# Patient Record
Sex: Male | Born: 1985 | Race: Black or African American | Hispanic: No | Smoking: Current every day smoker
Health system: Southern US, Community
[De-identification: ages and names within clinical notes are randomized; demographics above are authoritative.]

---

## 2009-07-25 ENCOUNTER — Emergency Department: Payer: Self-pay | Admitting: Emergency Medicine

## 2009-07-28 ENCOUNTER — Emergency Department: Payer: Self-pay | Admitting: Internal Medicine

## 2009-07-31 ENCOUNTER — Emergency Department: Payer: Self-pay | Admitting: Emergency Medicine

## 2012-07-16 ENCOUNTER — Encounter (HOSPITAL_COMMUNITY): Payer: Self-pay | Admitting: Family Medicine

## 2012-07-16 ENCOUNTER — Emergency Department (HOSPITAL_COMMUNITY)
Admission: EM | Admit: 2012-07-16 | Discharge: 2012-07-16 | Disposition: A | Discharge status: Home / Self Care | Payer: No Typology Code available for payment source | Attending: Emergency Medicine | Admitting: Emergency Medicine

## 2012-07-16 ENCOUNTER — Emergency Department (HOSPITAL_COMMUNITY): Payer: No Typology Code available for payment source

## 2012-07-16 DIAGNOSIS — Y9389 Activity, other specified: Secondary | ICD-10-CM | POA: Insufficient documentation

## 2012-07-16 DIAGNOSIS — R42 Dizziness and giddiness: Secondary | ICD-10-CM | POA: Insufficient documentation

## 2012-07-16 DIAGNOSIS — Y92410 Unspecified street and highway as the place of occurrence of the external cause: Secondary | ICD-10-CM | POA: Insufficient documentation

## 2012-07-16 DIAGNOSIS — S060X9A Concussion with loss of consciousness of unspecified duration, initial encounter: Secondary | ICD-10-CM

## 2012-07-16 DIAGNOSIS — S060XAA Concussion with loss of consciousness status unknown, initial encounter: Secondary | ICD-10-CM | POA: Insufficient documentation

## 2012-07-16 DIAGNOSIS — F17200 Nicotine dependence, unspecified, uncomplicated: Secondary | ICD-10-CM | POA: Insufficient documentation
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
## 2012-07-18 ENCOUNTER — Emergency Department: Payer: Self-pay | Admitting: Emergency Medicine

## 2013-08-13 ENCOUNTER — Emergency Department: Payer: Self-pay

## 2013-08-15 LAB — BETA STREP CULTURE(ARMC)
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
## 2016-10-29 ENCOUNTER — Encounter: Payer: Self-pay | Admitting: Emergency Medicine

## 2016-10-29 ENCOUNTER — Emergency Department
Admission: EM | Admit: 2016-10-29 | Discharge: 2016-10-29 | Disposition: A | Discharge status: Home / Self Care | Payer: Self-pay | Attending: Emergency Medicine | Admitting: Emergency Medicine

## 2016-10-29 DIAGNOSIS — M778 Other enthesopathies, not elsewhere classified: Secondary | ICD-10-CM

## 2016-10-29 DIAGNOSIS — M779 Enthesopathy, unspecified: Secondary | ICD-10-CM | POA: Insufficient documentation

## 2016-10-29 DIAGNOSIS — F17200 Nicotine dependence, unspecified, uncomplicated: Secondary | ICD-10-CM | POA: Insufficient documentation

## 2016-10-29 DIAGNOSIS — K047 Periapical abscess without sinus: Secondary | ICD-10-CM | POA: Insufficient documentation

## 2016-10-29 MED ORDER — AMOXICILLIN 500 MG PO CAPS: 500.0000 mg | ORAL_CAPSULE | Freq: Three times a day (TID) | ORAL | 0 refills | Status: DC

## 2016-10-29 MED ORDER — NAPROXEN 500 MG PO TABS: 500.0000 mg | ORAL_TABLET | Freq: Two times a day (BID) | ORAL | Status: DC

## 2016-10-29 MED ORDER — TRAMADOL HCL 50 MG PO TABS: 50.0000 mg | ORAL_TABLET | Freq: Four times a day (QID) | ORAL | 0 refills | Status: DC | PRN

## 2016-10-29 NOTE — ED Triage Notes (Signed)
Pt presents to ED with c/o R sided facial swelling at the upper jaw line. Pt states swelling started yesterday. Pt states a few weeks ago a tooth fell out of his R upper jaw. Pt also c/o L elbow pain, pt states pain is intermittent and intermittent difficulty straightening out his L arm due to the pain.

## 2016-10-29 NOTE — ED Notes (Signed)
Pt states he woke up this AM with swelling to the right side of his face, states he believes he has a dental abscess, resp even and unlabored, in no acute distress

## 2016-10-29 NOTE — ED Provider Notes (Signed)
[facility] Emergency Department Provider Note   ____________________________________________   None    (approximate)  I have reviewed the triage vital signs and the nursing notes.   HISTORY  Chief Complaint Facial Swelling and Elbow Pain    HPI Henry Mccann is a 31 y.o. male patient complaining of right facial swelling started yesterday. Patient denies sinus congestion or ear pressure/pain. Patient denies any fever at this complaint. Patient state 2 weeks ago tooth fell out of his right upper jaw. Patient also complaining left elbow pain. He stated this been intermittently for over 3 months. Patient stated times it hurts to extend the forearm. Patient stated pain is better today. Patient denies any injury or repetitive motions of the elbow. Patient rates his dental pain as a 1/10. No palliative measures for both complaints.   History reviewed. No pertinent past medical history.  There are no active problems to display for this patient.   History reviewed. No pertinent surgical history.  Prior to Admission medications   Medication Sig Start Date End Date Taking? Authorizing Provider  amoxicillin (AMOXIL) 500 MG capsule Take 1 capsule (500 mg total) by mouth 3 (three) times daily. 10/29/16   Joni Reining, PA-C  naproxen (NAPROSYN) 500 MG tablet Take 1 tablet (500 mg total) by mouth 2 (two) times daily with a meal. 10/29/16   Joni Reining, PA-C  traMADol (ULTRAM) 50 MG tablet Take 1 tablet (50 mg total) by mouth every 6 (six) hours as needed for moderate pain. 10/29/16   Joni Reining, PA-C    Allergies Patient has no known allergies.  History reviewed. No pertinent family history.  Social History Social History  Substance Use Topics  . Smoking status: Current Every Day Smoker  . Smokeless tobacco: Never Used  . Alcohol use No    Review of Systems Constitutional: No fever/chills Eyes: No visual changes. ENT: No sore throat.Dental  pain Cardiovascular: Denies chest pain. Respiratory: Denies shortness of breath. Gastrointestinal: No abdominal pain.  No nausea, no vomiting.  No diarrhea.  No constipation. Genitourinary: Negative for dysuria. Musculoskeletal: Left elbow.  Skin: Negative for rash. Facial swelling Neurological: Negative for headaches, focal weakness or numbness.    ____________________________________________   PHYSICAL EXAM:  VITAL SIGNS: ED Triage Vitals  Enc Vitals Group     BP 10/29/16 1306 129/87     Pulse Rate 10/29/16 1306 71     Resp 10/29/16 1306 18     Temp 10/29/16 1306 98.5 F (36.9 C)     Temp Source 10/29/16 1306 Oral     SpO2 10/29/16 1306 100 %     Weight 10/29/16 1303 280 lb (127 kg)     Height 10/29/16 1303  (1.676 m)     Head Circumference --      Peak Flow --      Pain Score 10/29/16 1303 1     Pain Loc --      Pain Edu? --      Excl. in GC? --     Constitutional: Alert and oriented. Well appearing and in no acute distress. Eyes: Conjunctivae are normal. PERRL. EOMI. Head: Atraumatic. Nose: No congestion/rhinnorhea. Mouth/Throat: Mucous membranes are moist.  Oropharynx non-erythematous.Edematous gingiva right upper molar area. Neck: No stridor.  No cervical spine tenderness to palpation. Hematological/Lymphatic/Immunilogical: No cervical lymphadenopathy. Cardiovascular: Normal rate, regular rhythm. Grossly normal heart sounds.  Good peripheral circulation. Respiratory: Normal respiratory effort.  No retractions. Lungs CTAB. Gastrointestinal: Soft and nontender.  No distention. No abdominal bruits. No CVA tenderness. Musculoskeletal: No lower extremity tenderness nor edema.  No joint effusions. Neurologic:  Normal speech and language. No gross focal neurologic deficits are appreciated. No gait instability. Skin:  Skin is warm, dry and intact. No rash noted. Psychiatric: Mood and affect are normal. Speech and behavior are  normal.  ____________________________________________   LABS (all labs ordered are listed, but only abnormal results are displayed)  Labs Reviewed - No data to display ____________________________________________  EKG   ____________________________________________  RADIOLOGY   ____________________________________________   PROCEDURES  Procedure(s) performed: None  Procedures  Critical Care performed: No  ____________________________________________   INITIAL IMPRESSION / ASSESSMENT AND PLAN / ED COURSE  Pertinent labs & imaging results that were available during my care of the patient were reviewed by me and considered in my medical decision making (see chart for details).  Dental abscess and tendinitis of the elbow.      ____________________________________________   FINAL CLINICAL IMPRESSION(S) / ED DIAGNOSES  Final diagnoses:  Dental abscess  Left elbow tendinitis   Patient given discharge Instructions and advised to follow-up with [facility]. Patient also advised [facility].   NEW MEDICATIONS STARTED DURING THIS VISIT:  New Prescriptions   AMOXICILLIN (AMOXIL) 500 MG CAPSULE    Take 1 capsule (500 mg total) by mouth 3 (three) times daily.   NAPROXEN (NAPROSYN) 500 MG TABLET    Take 1 tablet (500 mg total) by mouth 2 (two) times daily with a meal.   TRAMADOL (ULTRAM) 50 MG TABLET    Take 1 tablet (50 mg total) by mouth every 6 (six) hours as needed for moderate pain.     Note:  This document was prepared using Dragon voice recognition software and may include unintentional dictation errors.    Joni Reining, PA-C 10/29/16 1428    Nita Sickle, MD 11/02/16 [phone]

## 2016-11-05 ENCOUNTER — Encounter: Payer: Self-pay | Admitting: Emergency Medicine

## 2016-11-05 DIAGNOSIS — K029 Dental caries, unspecified: Secondary | ICD-10-CM | POA: Insufficient documentation

## 2016-11-05 DIAGNOSIS — F17200 Nicotine dependence, unspecified, uncomplicated: Secondary | ICD-10-CM | POA: Insufficient documentation

## 2016-11-05 DIAGNOSIS — J029 Acute pharyngitis, unspecified: Secondary | ICD-10-CM | POA: Insufficient documentation

## 2016-11-05 LAB — POCT RAPID STREP A: Streptococcus, Group A Screen (Direct): NEGATIVE

## 2016-11-05 NOTE — ED Triage Notes (Signed)
Pt ambulatory to triage with steady gait, no distress noted. Pt c/o of cough, congestion, sore throat and swelling on both sides of neck. Pt reports he was seen last Friday for facial abscess, placed on antibiotics, on Tuesday had tooth taken out of right top, today pt sts "I think this all may have something to do with each other."

## 2016-11-06 ENCOUNTER — Emergency Department
Admission: EM | Admit: 2016-11-06 | Discharge: 2016-11-06 | Disposition: A | Discharge status: Home / Self Care | Payer: Self-pay | Attending: Emergency Medicine | Admitting: Emergency Medicine

## 2016-11-06 DIAGNOSIS — R220 Localized swelling, mass and lump, head: Secondary | ICD-10-CM

## 2016-11-06 DIAGNOSIS — J029 Acute pharyngitis, unspecified: Secondary | ICD-10-CM

## 2016-11-06 DIAGNOSIS — K029 Dental caries, unspecified: Secondary | ICD-10-CM

## 2016-11-06 MED ORDER — DEXAMETHASONE SODIUM PHOSPHATE 10 MG/ML IJ SOLN
10.0000 mg | Freq: Once | INTRAMUSCULAR | Status: AC
  Administered 2016-11-06: 10 mg via INTRAMUSCULAR
  Filled 2016-11-06: qty 1

## 2016-11-06 MED ORDER — KETOROLAC TROMETHAMINE 60 MG/2ML IM SOLN
15.0000 mg | INTRAMUSCULAR | Status: AC
  Administered 2016-11-06: 15 mg via INTRAMUSCULAR
  Filled 2016-11-06: qty 2

## 2016-11-06 NOTE — ED Notes (Signed)
ED Provider at bedside. 

## 2016-11-06 NOTE — Discharge Instructions (Signed)
As we discussed, the only way to be certain that you do not have a spreading deep infection within your face or neck is to obtain another CT scan.  I think that the risk of this currently is low, however, and you decided you do not want to have the CT scan today.  I feel that is appropriate and instead we gave you a dose of Decadron 10 mg intramuscular which should help with the pain and swelling.  I encourage you to continue taking the pain medicine you were prescribed previously and over-the-counter medicine and please definitely continue your antibiotics.  Follow up with a dentist and try to establish a primary care doctor.  If you develop any new or worsening symptoms, including but not limited to fever, worsening swelling, difficulty breathing, etc., please return immediately to the nearest emergency department.

## 2016-11-06 NOTE — ED Provider Notes (Signed)
[facility] Emergency Department Provider Note  ____________________________________________   First MD Initiated Contact with Patient 11/06/16 0139     (approximate)  I have reviewed the triage vital signs and the nursing notes.   HISTORY  Chief Complaint Cough and Sore Throat    HPI Henry Mccann is a 31 y.o. male who presents by private vehicle for evaluation of nasal congestion, sore throat, some swelling of his face and neck.  He was seen recently in this emergency department and started on antibiotics for a dental abscess.  He subsequently presented to [facility], was evaluated in the emergency department and had a CT scan which was reassuring and then went to the dental clinic for partial extractions.His [facility] visit was less than 3 days ago.  He is still taking the amoxicillin and states that the swelling is actually gotten better but that his sore throat has persisted.  He says it hurts worse when he swallows.  Nothing in particular makes it better or worse.  He thinks his voice seems muffled but it is hard to tell because his mouth is still swollen as well.  He denies fever/chills, chest pain, shortness of breath, nausea, vomiting.  He states that his nose is been really congested recently 2 and he has had a lot of drainage down the back of his throat and he thinks that might be why his throat is sore.  The sore throat has been present for or than 3 days.   History reviewed. No pertinent past medical history.  There are no active problems to display for this patient.   History reviewed. No pertinent surgical history.  Prior to Admission medications   Medication Sig Start Date End Date Taking? Authorizing Provider  amoxicillin (AMOXIL) 500 MG capsule Take 1 capsule (500 mg total) by mouth 3 (three) times daily. 10/29/16   Joni Reining, PA-C  naproxen (NAPROSYN) 500 MG tablet Take 1 tablet (500 mg total) by mouth 2 (two) times daily with a meal. 10/29/16    Joni Reining, PA-C  traMADol (ULTRAM) 50 MG tablet Take 1 tablet (50 mg total) by mouth every 6 (six) hours as needed for moderate pain. 10/29/16   Joni Reining, PA-C    Allergies Patient has no known allergies.  History reviewed. No pertinent family history.  Social History Social History  Substance Use Topics  . Smoking status: Current Every Day Smoker  . Smokeless tobacco: Never Used  . Alcohol use No    Review of Systems Constitutional: No fever/chills Eyes: No visual changes. ENT: Sore throat and dental pain with swelling which is improving over the last 3 days but still present Cardiovascular: Denies chest pain. Respiratory: Denies shortness of breath. Gastrointestinal: No abdominal pain.  No nausea, no vomiting.  No diarrhea.  No constipation. Genitourinary: Negative for dysuria. Musculoskeletal: Negative for back pain. Integumentary: Negative for rash. Neurological: Negative for headaches, focal weakness or numbness.   ____________________________________________   PHYSICAL EXAM:  VITAL SIGNS: ED Triage Vitals  Enc Vitals Group     BP 11/05/16 2259 135/86     Pulse Rate 11/05/16 2259 82     Resp 11/05/16 2259 16     Temp 11/05/16 2259 98.6 F (37 C)     Temp Source 11/05/16 2259 Oral     SpO2 11/05/16 2259 98 %     Weight 11/05/16 2300 280 lb (127 kg)     Height --      Head Circumference --  Peak Flow --      Pain Score 11/06/16 0125 10     Pain Loc --      Pain Edu? --      Excl. in GC? --     Constitutional: Alert and oriented. Well appearing and in no acute distress. Eyes: Conjunctivae are normal. PERRL. EOMI. Head: Atraumatic.  Right sided maxillary swelling Nose: +congestion Mouth/Throat: Mucous membranes are moist. Oropharynx erythematous with exudate nor any significant tonsillar swelling.  No evidence of peritonsillar abscess.  Chronic dental caries and some swelling around the site of his recent extraction in the right upper teeth  with surrounding facial swelling which the patient says is improving Neck: No stridor.  No meningeal signs.  No cervical lymphadenopathy.  No brawny induration, no submandibular tenderness, no tenderness to palpation or manipulation of the larynx. Cardiovascular: Normal rate, regular rhythm. Good peripheral circulation. Grossly normal heart sounds. Respiratory: Normal respiratory effort.  No retractions. Lungs CTAB. Gastrointestinal: Soft and nontender. No distention.  Musculoskeletal: No lower extremity tenderness nor edema. No gross deformities of extremities. Neurologic:  Normal speech and language. No gross focal neurologic deficits are appreciated.  Skin:  Skin is warm, dry and intact. No rash noted. Psychiatric: Mood and affect are normal. Speech and behavior are normal.  ____________________________________________   LABS (all labs ordered are listed, but only abnormal results are displayed)  Labs Reviewed  CULTURE, GROUP A STREP [facility])  POCT RAPID STREP A   ____________________________________________  EKG  None - EKG not ordered by ED physician ____________________________________________  RADIOLOGY   No results found.  ____________________________________________   PROCEDURES  Critical Care performed: No   Procedure(s) performed:   Procedures   ____________________________________________   INITIAL IMPRESSION / ASSESSMENT AND PLAN / ED COURSE  Pertinent labs & imaging results that were available during my care of the patient were reviewed by me and considered in my medical decision making (see chart for details).  The patient is well-appearing and in no acute distress with normal vital signs and afebrile.  The swelling in his face is reportedly improving.  He is on antibiotics.  He has obvious nasal congestion and thinks that the drip down the back of his throat may be causing the sore throat.  I had a discussion with him and his wife were explained  that I suspect this is all residual discomfort from his recent procedure.  He is currently on antibiotics and I encouraged him to continue taking them.  I explained that I have a very low suspicion that he has a deep neck infection or spreading infection within his face but I would be happy to obtain CT scans to make sure this is the case.  Alternatively we could provide a dose of Decadron to help with the swelling and the pain while he continues his antibiotics and he can come back if he develops fever or worsening symptoms.  He and his wife understand that I cannot be certain without imaging but they prefer to try the treatment and close follow-up or return to the ED if he gets worse.  I think that is appropriate.  I gave strict return precautions.      ____________________________________________  FINAL CLINICAL IMPRESSION(S) / ED DIAGNOSES  Final diagnoses:  Sore throat  Right facial swelling  Dental caries     MEDICATIONS GIVEN DURING THIS VISIT:  Medications  dexamethasone (DECADRON) injection 10 mg (10 mg Intramuscular Given 11/06/16 0240)  ketorolac (TORADOL) injection 15 mg (15 mg Intramuscular  Given 11/06/16 0240)     NEW OUTPATIENT MEDICATIONS STARTED DURING THIS VISIT:  New Prescriptions   No medications on file    Modified Medications   No medications on file    Discontinued Medications   No medications on file     Note:  This document was prepared using Dragon voice recognition software and may include unintentional dictation errors.    Loleta Rose, MD 11/06/16 [phone]

## 2016-11-08 LAB — CULTURE, GROUP A STREP (THRC)

## 2016-12-22 ENCOUNTER — Encounter: Payer: Self-pay | Admitting: *Deleted

## 2016-12-22 DIAGNOSIS — Z79899 Other long term (current) drug therapy: Secondary | ICD-10-CM | POA: Insufficient documentation

## 2016-12-22 DIAGNOSIS — K047 Periapical abscess without sinus: Secondary | ICD-10-CM | POA: Insufficient documentation

## 2016-12-22 DIAGNOSIS — F17200 Nicotine dependence, unspecified, uncomplicated: Secondary | ICD-10-CM | POA: Insufficient documentation

## 2016-12-22 NOTE — ED Triage Notes (Addendum)
Pt has right upper toothache for 4 days  Pt has swelling to right side of face.  No resp distress.  Taking bc's without relief.   Pt alert

## 2016-12-23 ENCOUNTER — Emergency Department
Admission: EM | Admit: 2016-12-23 | Discharge: 2016-12-23 | Disposition: A | Discharge status: Home / Self Care | Payer: Self-pay | Attending: Emergency Medicine | Admitting: Emergency Medicine

## 2016-12-23 DIAGNOSIS — K047 Periapical abscess without sinus: Secondary | ICD-10-CM

## 2016-12-23 DIAGNOSIS — K0889 Other specified disorders of teeth and supporting structures: Secondary | ICD-10-CM

## 2016-12-23 MED ORDER — IBUPROFEN 800 MG PO TABS
800.0000 mg | ORAL_TABLET | Freq: Once | ORAL | Status: AC
  Administered 2016-12-23: 800 mg via ORAL
  Filled 2016-12-23: qty 1

## 2016-12-23 MED ORDER — OXYCODONE-ACETAMINOPHEN 5-325 MG PO TABS: 1.0000 | ORAL_TABLET | ORAL | 0 refills | Status: DC | PRN

## 2016-12-23 MED ORDER — IBUPROFEN 800 MG PO TABS: 800.0000 mg | ORAL_TABLET | Freq: Three times a day (TID) | ORAL | 0 refills | Status: DC | PRN

## 2016-12-23 MED ORDER — OXYCODONE-ACETAMINOPHEN 5-325 MG PO TABS
1.0000 | ORAL_TABLET | Freq: Once | ORAL | Status: AC
  Administered 2016-12-23: 1 via ORAL
  Filled 2016-12-23: qty 1

## 2016-12-23 MED ORDER — AMOXICILLIN 500 MG PO CAPS: 500.0000 mg | ORAL_CAPSULE | Freq: Three times a day (TID) | ORAL | 0 refills | Status: DC

## 2016-12-23 MED ORDER — AMOXICILLIN 500 MG PO CAPS
500.0000 mg | ORAL_CAPSULE | Freq: Once | ORAL | Status: AC
  Administered 2016-12-23: 500 mg via ORAL
  Filled 2016-12-23: qty 1

## 2016-12-23 NOTE — Discharge Instructions (Signed)
1. Take antibiotic as prescribed (Amoxicillin 500mg three times daily x 7 days). °2. Take pain medicines as needed (Motrin/Percocet #15). °3. Return to the ER for worsening symptoms, persistent vomiting, fever, difficulty breathing or other concerns. ° °

## 2016-12-23 NOTE — ED Provider Notes (Signed)
[facility] Emergency Department Provider Note   ____________________________________________   First MD Initiated Contact with Patient 12/23/16 0037     (approximate)  I have reviewed the triage vital signs and the nursing notes.   HISTORY  Chief Complaint Dental Pain    HPI Henry Mccann is a 31 y.o. male who presents to the ED from home with a chief complaint of dentalgia. Patient reports right upper toothache 4 days. Developed swelling to the right side of his face 2 days ago. Similar symptoms 2 months ago; he was seen at [facility] dental clinic, placed on antibiotics and declined to have his tooth extracted.States he had a partial fracture of his tooth that became infected. This time, patient denies associated fever, chills, chest pain, shortness of breath, abdominal pain, nausea or vomiting. Nothing makes his symptoms better or worse.   Past medical history None for diabetes  There are no active problems to display for this patient.   No past surgical history on file.  Prior to Admission medications   Medication Sig Start Date End Date Taking? Authorizing Provider  amoxicillin (AMOXIL) 500 MG capsule Take 1 capsule (500 mg total) by mouth 3 (three) times daily. 12/23/16   Irean Hong, MD  ibuprofen (ADVIL,MOTRIN) 800 MG tablet Take 1 tablet (800 mg total) by mouth every 8 (eight) hours as needed for moderate pain. 12/23/16   Irean Hong, MD  naproxen (NAPROSYN) 500 MG tablet Take 1 tablet (500 mg total) by mouth 2 (two) times daily with a meal. 10/29/16   Joni Reining, PA-C  oxyCODONE-acetaminophen (ROXICET) 5-325 MG tablet Take 1 tablet by mouth every 4 (four) hours as needed for severe pain. 12/23/16   Irean Hong, MD  traMADol (ULTRAM) 50 MG tablet Take 1 tablet (50 mg total) by mouth every 6 (six) hours as needed for moderate pain. 10/29/16   Joni Reining, PA-C    Allergies Tramadol  No family history on file.  Social History Social  History  Substance Use Topics  . Smoking status: Current Every Day Smoker  . Smokeless tobacco: Never Used  . Alcohol use No    Review of Systems  Constitutional: No fever/chills. Eyes: No visual changes. ENT: Positive for dentalgia. No sore throat. Cardiovascular: Denies chest pain. Respiratory: Denies shortness of breath. Gastrointestinal: No abdominal pain.  No nausea, no vomiting.  No diarrhea.  No constipation. Genitourinary: Negative for dysuria. Musculoskeletal: Negative for back pain. Skin: Negative for rash. Neurological: Negative for headaches, focal weakness or numbness.   ____________________________________________   PHYSICAL EXAM:  VITAL SIGNS: ED Triage Vitals  Enc Vitals Group     BP 12/22/16 2246 137/80     Pulse Rate 12/22/16 2246 76     Resp 12/22/16 2246 18     Temp 12/22/16 2246 98.7 F (37.1 C)     Temp Source 12/22/16 2246 Oral     SpO2 12/22/16 2246 99 %     Weight 12/22/16 2247 280 lb (127 kg)     Height 12/22/16 2247 5\' 6"  (1.676 m)     Head Circumference --      Peak Flow --      Pain Score 12/22/16 2245 10     Pain Loc --      Pain Edu? --      Excl. in GC? --     Constitutional: Alert and oriented. Well appearing and in no acute distress. Eyes: Conjunctivae are normal. EOMI. Head: Atraumatic.  Nose: No congestion/rhinnorhea. Mouth/Throat: Right upper molar previously fractured at the gumline. Dental abscess and mild right-sided facial swelling noted. Mucous membranes are moist.  Oropharynx non-erythematous. Neck: No stridor.  Supple neck without meningismus. Cardiovascular: Normal rate, regular rhythm. Grossly normal heart sounds.  Good peripheral circulation. Respiratory: Normal respiratory effort.  No retractions. Lungs CTAB. Gastrointestinal: Soft and nontender. No distention. No abdominal bruits. No CVA tenderness. Musculoskeletal: No lower extremity tenderness nor edema.  No joint effusions. Neurologic:  Normal speech and  language. No gross focal neurologic deficits are appreciated. No gait instability. Skin:  Skin is warm, dry and intact. No rash noted. Psychiatric: Mood and affect are normal. Speech and behavior are normal.  ____________________________________________   LABS (all labs ordered are listed, but only abnormal results are displayed)  Labs Reviewed - No data to display ____________________________________________  EKG  None ____________________________________________  RADIOLOGY  No results found.  ____________________________________________   PROCEDURES  Procedure(s) performed: None  Procedures  Critical Care performed: No  ____________________________________________   INITIAL IMPRESSION / ASSESSMENT AND PLAN / ED COURSE  Pertinent labs & imaging results that were available during my care of the patient were reviewed by me and considered in my medical decision making (see chart for details).  31 year old male who presents with dentalgia secondary to dental abscess. Initiate antibiotics, analgesia and patient will follow[facility] next [facility]. Strict return precautions given. Patient and family member verbalize understanding and agree with plan of care.      ____________________________________________   FINAL CLINICAL IMPRESSION(S) / ED DIAGNOSES  Final diagnoses:  Dentalgia  Dental abscess      NEW MEDICATIONS STARTED DURING THIS VISIT:  New Prescriptions   AMOXICILLIN (AMOXIL) 500 MG CAPSULE    Take 1 capsule (500 mg total) by mouth 3 (three) times daily.   IBUPROFEN (ADVIL,MOTRIN) 800 MG TABLET    Take 1 tablet (800 mg total) by mouth every 8 (eight) hours as needed for moderate pain.   OXYCODONE-ACETAMINOPHEN (ROXICET) 5-325 MG TABLET    Take 1 tablet by mouth every 4 (four) hours as needed for severe pain.     Note:  This document was prepared using Dragon voice recognition software and may include unintentional dictation errors.     Irean HongSung, Jade J, MD 12/23/16 [phone]

## 2017-11-16 ENCOUNTER — Emergency Department
Admission: EM | Admit: 2017-11-16 | Discharge: 2017-11-16 | Disposition: A | Discharge status: Home / Self Care | Payer: Self-pay | Attending: Emergency Medicine | Admitting: Emergency Medicine

## 2017-11-16 ENCOUNTER — Other Ambulatory Visit: Payer: Self-pay

## 2017-11-16 DIAGNOSIS — X509XXA Other and unspecified overexertion or strenuous movements or postures, initial encounter: Secondary | ICD-10-CM | POA: Insufficient documentation

## 2017-11-16 DIAGNOSIS — Z79899 Other long term (current) drug therapy: Secondary | ICD-10-CM | POA: Insufficient documentation

## 2017-11-16 DIAGNOSIS — S161XXA Strain of muscle, fascia and tendon at neck level, initial encounter: Secondary | ICD-10-CM | POA: Insufficient documentation

## 2017-11-16 DIAGNOSIS — Y999 Unspecified external cause status: Secondary | ICD-10-CM | POA: Insufficient documentation

## 2017-11-16 DIAGNOSIS — Y929 Unspecified place or not applicable: Secondary | ICD-10-CM | POA: Insufficient documentation

## 2017-11-16 DIAGNOSIS — F17210 Nicotine dependence, cigarettes, uncomplicated: Secondary | ICD-10-CM | POA: Insufficient documentation

## 2017-11-16 DIAGNOSIS — T148XXA Other injury of unspecified body region, initial encounter: Secondary | ICD-10-CM

## 2017-11-16 DIAGNOSIS — Y93E8 Activity, other personal hygiene: Secondary | ICD-10-CM | POA: Insufficient documentation

## 2017-11-16 MED ORDER — KETOROLAC TROMETHAMINE 30 MG/ML IJ SOLN
30.0000 mg | Freq: Once | INTRAMUSCULAR | Status: AC
Start: 2017-11-16 — End: 2017-11-16
  Administered 2017-11-16: 30 mg via INTRAMUSCULAR
  Filled 2017-11-16: qty 1

## 2017-11-16 MED ORDER — CYCLOBENZAPRINE HCL 5 MG PO TABS: ORAL_TABLET | ORAL | 0 refills | Status: DC

## 2017-11-16 MED ORDER — KETOROLAC TROMETHAMINE 10 MG PO TABS: 10.0000 mg | ORAL_TABLET | Freq: Four times a day (QID) | ORAL | 0 refills | Status: DC | PRN

## 2017-11-16 NOTE — ED Notes (Signed)
See triage note  States he developed left sided neck pain which radiates into left shoulder yesterday  States pain started while shaving   Increased pain with movement

## 2017-11-16 NOTE — ED Provider Notes (Signed)
[facility] Emergency Department Provider Note  ____________________________________________  Time seen: Approximately 10:50 AM  I have reviewed the triage vital signs and the nursing notes.   HISTORY  Chief Complaint Neck Pain    HPI Henry Mccann is a 32 y.o. male that presents to the emergency department for evaluation of left-sided neck pain for 2 days.  Pain started while patient was shaving.  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  He has been massaging his back with some relief.  He was initially having difficulty turning his head but this has improved since being in the ED.  No OTC medications have been taken.  No trauma.  He denies fever, chills, headache, numbness, tingling.  History reviewed. No pertinent past medical history.  There are no active problems to display for this patient.   History reviewed. No pertinent surgical history.  Prior to Admission medications   Medication Sig Start Date End Date Taking? Authorizing Provider  amoxicillin (AMOXIL) 500 MG capsule Take 1 capsule (500 mg total) by mouth 3 (three) times daily. 12/23/16   Irean Hong, MD  cyclobenzaprine (FLEXERIL) 5 MG tablet Take 1-2 tablets 3 times daily as needed 11/16/17   Enid Derry, PA-C  ibuprofen (ADVIL,MOTRIN) 800 MG tablet Take 1 tablet (800 mg total) by mouth every 8 (eight) hours as needed for moderate pain. 12/23/16   Irean Hong, MD  ketorolac (TORADOL) 10 MG tablet Take 1 tablet (10 mg total) by mouth every 6 (six) hours as needed. 11/16/17   Enid Derry, PA-C  naproxen (NAPROSYN) 500 MG tablet Take 1 tablet (500 mg total) by mouth 2 (two) times daily with a meal. 10/29/16   Joni Reining, PA-C  oxyCODONE-acetaminophen (ROXICET) 5-325 MG tablet Take 1 tablet by mouth every 4 (four) hours as needed for severe pain. 12/23/16   Irean Hong, MD  traMADol (ULTRAM) 50 MG tablet Take 1 tablet (50 mg total) by mouth every 6 (six) hours as needed  for moderate pain. 10/29/16   Joni Reining, PA-C    Allergies Tramadol  No family history on file.  Social History Social History   Tobacco Use  . Smoking status: Current Every Day Smoker  . Smokeless tobacco: Never Used  Substance Use Topics  . Alcohol use: No  . Drug use: No     Review of Systems  Constitutional: No fever/chills Cardiovascular: No chest pain. Respiratory: No SOB. Gastrointestinal: No abdominal pain.  No nausea, no vomiting.  Musculoskeletal: Positive for neck pain.  Skin: Negative for rash, abrasions, lacerations, ecchymosis. Neurological: Negative for headaches, numbness or tingling   ____________________________________________   PHYSICAL EXAM:  VITAL SIGNS: ED Triage Vitals [11/16/17 1030]  Enc Vitals Group     BP 128/78     Pulse Rate 64     Resp 18     Temp 97.9 F (36.6 C)     Temp Source Oral     SpO2 99 %     Weight 280 lb (127 kg)     Height  (1.676 m)     Head Circumference      Peak Flow      Pain Score 10     Pain Loc      Pain Edu?      Excl. in GC?      Constitutional: Alert and oriented. Well appearing and in no acute distress. Eyes: Conjunctivae are normal. PERRL. EOMI. Head: Atraumatic. ENT:  Ears:      Nose: No congestion/rhinnorhea.      Mouth/Throat: Mucous membranes are moist.  Neck: No stridor. No cervical spine tenderness to palpation.  Tenderness to palpation of left trapezius muscle.  Full range of motion of neck.  Minimal pain with left and right rotation of neck.  No pain with flexion or extension. Cardiovascular: Normal rate, regular rhythm.  Good peripheral circulation. Respiratory: Normal respiratory effort without tachypnea or retractions. Lungs CTAB. Good air entry to the bases with no decreased or absent breath sounds. Musculoskeletal: Full range of motion to all extremities. No gross deformities appreciated. Neurologic:  Normal speech and language. No gross focal neurologic deficits are  appreciated.  Skin:  Skin is warm, dry and intact. No rash noted. Psychiatric: Mood and affect are normal. Speech and behavior are normal. Patient exhibits appropriate insight and judgement.   ____________________________________________   LABS (all labs ordered are listed, but only abnormal results are displayed)  Labs Reviewed - No data to display ____________________________________________  EKG   ____________________________________________  RADIOLOGY  No results found.  ____________________________________________    PROCEDURES  Procedure(s) performed:    Procedures    Medications  ketorolac (TORADOL) 30 MG/ML injection 30 mg (30 mg Intramuscular Given 11/16/17 1146)     ____________________________________________   INITIAL IMPRESSION / ASSESSMENT AND PLAN / ED COURSE  Pertinent labs & imaging results that were available during my care of the patient were reviewed by me and considered in my medical decision making (see chart for details).  Review of the [place] CSRS was performed in accordance of the [facility] prior to dispensing any controlled drugs.   Patient presented to emergency department for evaluation of neck pain for 2 days.  Vital signs and exam are reassuring.   No indication for imaging at this time.  Patient is agreeable.  IM Toradol was given.  Patient will be discharged home with prescriptions for flexeril and toradol. Patient is to follow up with PCP as directed. Patient is given ED precautions to return to the ED for any worsening or new symptoms.     ____________________________________________  FINAL CLINICAL IMPRESSION(S) / ED DIAGNOSES  Final diagnoses:  Muscle strain      NEW MEDICATIONS STARTED DURING THIS VISIT:  ED Discharge Orders        Ordered    cyclobenzaprine (FLEXERIL) 5 MG tablet     11/16/17 1211    ketorolac (TORADOL) 10 MG tablet  Every 6 hours PRN     11/16/17 1211          This chart was dictated using  voice recognition software/Dragon. Despite best efforts to proofread, errors can occur which can change the meaning. Any change was purely unintentional.    Enid Derry, PA-C 11/16/17 1247    Sharman Cheek, MD 11/16/17 [phone]

## 2017-11-16 NOTE — ED Triage Notes (Signed)
Pt c/o left shoulder and neck pain that started yesterday while at work., states it hurts to move,

## 2018-10-05 ENCOUNTER — Other Ambulatory Visit: Payer: Self-pay

## 2018-10-05 ENCOUNTER — Encounter: Payer: Self-pay | Admitting: Emergency Medicine

## 2018-10-05 ENCOUNTER — Emergency Department
Admission: EM | Admit: 2018-10-05 | Discharge: 2018-10-05 | Disposition: A | Discharge status: Home / Self Care | Payer: Self-pay | Attending: Student in an Organized Health Care Education/Training Program | Admitting: Student in an Organized Health Care Education/Training Program

## 2018-10-05 DIAGNOSIS — H5713 Ocular pain, bilateral: Secondary | ICD-10-CM

## 2018-10-05 MED ORDER — NAPHAZOLINE-PHENIRAMINE 0.025-0.3 % OP SOLN: 1.0000 [drp] | Freq: Four times a day (QID) | OPHTHALMIC | 0 refills | Status: AC | PRN

## 2018-10-05 MED ORDER — KETOROLAC TROMETHAMINE 0.4 % OP SOLN: 1.0000 [drp] | Freq: Four times a day (QID) | OPHTHALMIC | 0 refills | Status: AC | PRN

## 2018-10-05 MED ORDER — CETIRIZINE HCL 10 MG PO TABS: 10.0000 mg | ORAL_TABLET | Freq: Every day | ORAL | 0 refills | Status: AC

## 2018-10-05 NOTE — ED Triage Notes (Signed)
Patient reports pain starting in left eye with blinking and touching eye for several days. Now reports same pain in both eyes. Also reports waking up with "crusting" around eyes. Patient denies blurred vision or dizziness.

## 2018-10-05 NOTE — ED Notes (Signed)
See triage note  Presents with pain and drainage to eyes this am

## 2018-10-05 NOTE — ED Provider Notes (Signed)
[facility] Emergency Department Provider Note  ____________________________________________  Time seen: Approximately 1:55 PM  I have reviewed the triage vital signs and the nursing notes.   HISTORY  Chief Complaint Eye Pain    HPI Henry Mccann is a 33 y.o. male that presents to the emergency department for evaluation of bilateral pain surrounding his eyes for 3 days.  Pain started in left eye and moved to right eye.  Patient states that his eyes are crusting throughout the day.  Patient states that symptoms happened previously 1 to 2 months ago but resolved on their own.  He is occasionally sensitive to light.  Pain occasionally gives him a headache.  No pain directly to his eyes.  Patient does get seasonal allergies but usually presents as rhinorrhea.  He does not wear glasses or contacts.  No eye drainage, blurry vision, vision changes, trauma, nausea, vomiting.  History reviewed. No pertinent past medical history.  There are no active problems to display for this patient.   History reviewed. No pertinent surgical history.  Prior to Admission medications   Medication Sig Start Date End Date Taking? Authorizing Provider  cetirizine (ZYRTEC ALLERGY) 10 MG tablet Take 1 tablet (10 mg total) by mouth daily. 10/05/18   Enid Derry, PA-C  ketorolac (ACULAR) 0.4 % SOLN Place 1 drop into both eyes 4 (four) times daily as needed. 10/05/18   Enid Derry, PA-C  naphazoline-pheniramine (NAPHCON-A) 0.025-0.3 % ophthalmic solution Place 1 drop into both eyes 4 (four) times daily as needed for eye irritation. 10/05/18   Enid Derry, PA-C    Allergies Tramadol  No family history on file.  Social History Social History   Tobacco Use  . Smoking status: Current Every Day Smoker  . Smokeless tobacco: Never Used  Substance Use Topics  . Alcohol use: Yes  . Drug use: Yes    Types: Marijuana     Review of Systems  Constitutional: No fever/chills ENT:  No upper respiratory complaints. Cardiovascular: No chest pain. Respiratory: No cough. No SOB. Gastrointestinal: No nausea, no vomiting.  Musculoskeletal: Negative for musculoskeletal pain. Skin: Negative for rash, abrasions, lacerations, ecchymosis.   ____________________________________________   PHYSICAL EXAM:  VITAL SIGNS: ED Triage Vitals  Enc Vitals Group     BP 10/05/18 1343 122/67     Pulse Rate 10/05/18 1343 79     Resp 10/05/18 1343 16     Temp 10/05/18 1343 98.1 F (36.7 C)     Temp Source 10/05/18 1343 Oral     SpO2 10/05/18 1343 100 %     Weight 10/05/18 1344 280 lb (127 kg)     Height 10/05/18 1344 5\' 6"  (1.676 m)     Head Circumference --      Peak Flow --      Pain Score 10/05/18 1344 6     Pain Loc --      Pain Edu? --      Excl. in GC? --      Constitutional: Alert and oriented. Well appearing and in no acute distress. Eyes: Conjunctivae are normal. PERRL. EOMI. Minimal swelling to bilateral upper eyelids.  No crusting or drainage present. Head: Atraumatic. ENT:      Ears:      Nose: No congestion/rhinnorhea.      Mouth/Throat: Mucous membranes are moist.  Neck: No stridor.  Cardiovascular: Normal rate, regular rhythm.  Good peripheral circulation. Respiratory: Normal respiratory effort without tachypnea or retractions. Lungs CTAB. Good air entry to the  bases with no decreased or absent breath sounds. Musculoskeletal: Full range of motion to all extremities. No gross deformities appreciated. Neurologic:  Normal speech and language. No gross focal neurologic deficits are appreciated.  Skin:  Skin is warm, dry and intact. No rash noted. Psychiatric: Mood and affect are normal. Speech and behavior are normal. Patient exhibits appropriate insight and judgement.   ____________________________________________   LABS (all labs ordered are listed, but only abnormal results are displayed)  Labs Reviewed - No data to  display ____________________________________________  EKG   ____________________________________________  RADIOLOGY   No results found.  ____________________________________________    PROCEDURES  Procedure(s) performed:    Procedures    Medications - No data to display   ____________________________________________   INITIAL IMPRESSION / ASSESSMENT AND PLAN / ED COURSE  Pertinent labs & imaging results that were available during my care of the patient were reviewed by me and considered in my medical decision making (see chart for details).  Review of the [place] CSRS was performed in accordance of the [facility] prior to dispensing any controlled drugs.     Patient presented to the emergency department for evaluation of eyelid pain.  Vital signs and exam are reassuring.  Symptoms are consistent with blepharitis.  He will begin using warm compresses and use ketorolac eyedrops.  Patient will also begin allergy eyedrops and daily allergy medications for his seasonal allergies, which may be contributing.  Patient will be discharged home with prescriptions for Visine, ketorolac, Zyrtec. Patient is to follow up with ophthalmology as directed. Patient is given ED precautions to return to the ED for any worsening or new symptoms.     ____________________________________________  FINAL CLINICAL IMPRESSION(S) / ED DIAGNOSES  Final diagnoses:  Pain of both eyes      NEW MEDICATIONS STARTED DURING THIS VISIT:  ED Discharge Orders         Ordered    ketorolac (ACULAR) 0.4 % SOLN  4 times daily PRN     10/05/18 1409    naphazoline-pheniramine (NAPHCON-A) 0.025-0.3 % ophthalmic solution  4 times daily PRN     10/05/18 1409    cetirizine (ZYRTEC ALLERGY) 10 MG tablet  Daily     10/05/18 1409              This chart was dictated using voice recognition software/Dragon. Despite best efforts to proofread, errors can occur which can change the meaning. Any change was  purely unintentional.    Enid Derry, PA-C 10/05/18 2113    Willy Eddy, MD 10/05/18 [phone]

## 2018-12-11 ENCOUNTER — Encounter: Payer: Self-pay | Admitting: Emergency Medicine

## 2018-12-11 ENCOUNTER — Other Ambulatory Visit: Payer: Self-pay

## 2018-12-11 ENCOUNTER — Emergency Department: Payer: Self-pay

## 2018-12-11 ENCOUNTER — Emergency Department
Admission: EM | Admit: 2018-12-11 | Discharge: 2018-12-12 | Disposition: A | Discharge status: Home / Self Care | Payer: Self-pay | Attending: Emergency Medicine | Admitting: Emergency Medicine

## 2018-12-11 DIAGNOSIS — Z79899 Other long term (current) drug therapy: Secondary | ICD-10-CM | POA: Insufficient documentation

## 2018-12-11 DIAGNOSIS — F17210 Nicotine dependence, cigarettes, uncomplicated: Secondary | ICD-10-CM | POA: Insufficient documentation

## 2018-12-11 DIAGNOSIS — R079 Chest pain, unspecified: Secondary | ICD-10-CM | POA: Insufficient documentation

## 2018-12-11 LAB — COMPREHENSIVE METABOLIC PANEL
ALT: 53 U/L — ABNORMAL HIGH (ref 0–44)
AST: 40 U/L (ref 15–41)
Albumin: 4.5 g/dL (ref 3.5–5.0)
Alkaline Phosphatase: 70 U/L (ref 38–126)
Anion gap: 15 (ref 5–15)
BUN: 14 mg/dL (ref 6–20)
CO2: 20 mmol/L — ABNORMAL LOW (ref 22–32)
Calcium: 9.4 mg/dL (ref 8.9–10.3)
Chloride: 103 mmol/L (ref 98–111)
Creatinine, Ser: 1.25 mg/dL — ABNORMAL HIGH (ref 0.61–1.24)
GFR calc Af Amer: >60 mL/min (ref >60)
GFR calc non Af Amer: >60 mL/min (ref >60)
Glucose, Bld: 115 mg/dL — ABNORMAL HIGH (ref 70–99)
Potassium: 2.7 mmol/L — CL (ref 3.5–5.1)
Sodium: 138 mmol/L (ref 135–145)
Total Bilirubin: 0.8 mg/dL (ref 0.3–1.2)
Total Protein: 7.8 g/dL (ref 6.5–8.1)

## 2018-12-11 LAB — CBC
HCT: 43.8 % (ref 39.0–52.0)
Hemoglobin: 14.4 g/dL (ref 13.0–17.0)
MCH: 30.2 pg (ref 26.0–34.0)
MCHC: 32.9 g/dL (ref 30.0–36.0)
MCV: 91.8 fL (ref 80.0–100.0)
Platelets: 380 10*3/uL (ref 150–400)
RBC: 4.77 MIL/uL (ref 4.22–5.81)
RDW: 12.6 % (ref 11.5–15.5)
WBC: 19.2 10*3/uL — ABNORMAL HIGH (ref 4.0–10.5)
nRBC: 0 % (ref 0.0–0.2)

## 2018-12-11 LAB — FIBRIN DERIVATIVES D-DIMER (ARMC ONLY): Fibrin derivatives D-dimer (ARMC): 232.43 ng/mL (FEU) (ref 0.00–499.00)

## 2018-12-11 LAB — LIPASE, BLOOD: Lipase: 23 U/L (ref 11–51)

## 2018-12-11 LAB — TROPONIN I: Troponin I: <0.03 ng/mL (ref <0.03)

## 2018-12-11 MED ORDER — LORAZEPAM 2 MG/ML IJ SOLN
1.0000 mg | Freq: Once | INTRAMUSCULAR | Status: AC
  Administered 2018-12-11: 1 mg via INTRAVENOUS
  Filled 2018-12-11: qty 1

## 2018-12-11 MED ORDER — POTASSIUM CHLORIDE CRYS ER 20 MEQ PO TBCR
40.0000 meq | EXTENDED_RELEASE_TABLET | Freq: Once | ORAL | Status: AC
  Administered 2018-12-11: 40 meq via ORAL
  Filled 2018-12-11: qty 2

## 2018-12-11 MED ORDER — POTASSIUM CHLORIDE CRYS ER 10 MEQ PO TBCR: 10.0000 meq | EXTENDED_RELEASE_TABLET | Freq: Two times a day (BID) | ORAL | 0 refills | Status: AC

## 2018-12-11 NOTE — ED Provider Notes (Signed)
[facility] Emergency Department Provider Note  Time seen: 9:04 PM  I have reviewed the triage vital signs and the nursing notes.   HISTORY  Chief Complaint Chest Pain   HPI Henry Mccann is a 33 y.o. male with no past medical history presents to the emergency department for chest pain.  According to EMS and the patient he states around 2 PM today he finished mowing his lawn and developed central chest pain with some radiation into his neck.  Patient states the pain was intermittent but continued this evening so he called EMS.  Patient denies any shortness of breath earlier, fever cough or congestion.  Denies any nausea or diaphoresis at any point.  Currently patient denies any chest pain at this time does state very slight shortness of breath currently.   Patient arrives via emergency traffic, code STEMI called in the field.  History reviewed. No pertinent past medical history.  There are no active problems to display for this patient.   History reviewed. No pertinent surgical history.  Prior to Admission medications   Medication Sig Start Date End Date Taking? Authorizing Provider  cetirizine (ZYRTEC ALLERGY) 10 MG tablet Take 1 tablet (10 mg total) by mouth daily. 10/05/18   Enid Derry, PA-C  ketorolac (ACULAR) 0.4 % SOLN Place 1 drop into both eyes 4 (four) times daily as needed. 10/05/18   Enid Derry, PA-C  naphazoline-pheniramine (NAPHCON-A) 0.025-0.3 % ophthalmic solution Place 1 drop into both eyes 4 (four) times daily as needed for eye irritation. 10/05/18   Enid Derry, PA-C    Allergies  Allergen Reactions  . Tramadol Itching    History reviewed. No pertinent family history.  Social History Social History   Tobacco Use  . Smoking status: Current Every Day Smoker    Packs/day: 1.00    Types: Cigarettes  . Smokeless tobacco: Never Used  Substance Use Topics  . Alcohol use: Yes    Alcohol/week: 5.0 standard drinks    Types: 5  Cans of beer per week    Comment: daily  . Drug use: Yes    Types: Marijuana    Review of Systems Constitutional: Negative for fever. Cardiovascular: Positive for central chest pain radiating to the left neck. Respiratory: Negative for shortness of breath. Gastrointestinal: Negative for abdominal pain, vomiting Musculoskeletal: Negative for musculoskeletal complaints Skin: Negative for skin complaints  Neurological: Negative for headache All other ROS negative  ____________________________________________   PHYSICAL EXAM:  VITAL SIGNS: ED Triage Vitals [12/11/18 2101]  Enc Vitals Group     BP      Pulse      Resp      Temp      Temp src      SpO2      Weight (!) 315 lb 6 oz (143.1 kg)     Height 5\' 6"  (1.676 m)     Head Circumference      Peak Flow      Pain Score 0     Pain Loc      Pain Edu?      Excl. in [place]?    Constitutional: Alert and oriented. Well appearing and in no distress. Eyes: Normal exam ENT      Head: Normocephalic and atraumatic      Mouth/Throat: Mucous membranes are moist. Cardiovascular: Normal rate, regular rhythm.  Respiratory: Normal respiratory effort without tachypnea nor retractions. Breath sounds are clear Gastrointestinal: Soft and nontender. No distention.   Musculoskeletal: Nontender with normal  range of motion in all extremities.  Neurologic:  Normal speech and language. No gross focal neurologic deficits  Skin:  Skin is warm, dry and intact.  Psychiatric: Mood and affect are normal.   ____________________________________________    EKG  EKG viewed and interpreted by myself shows sinus tachycardia 108 bpm with a narrow QRS, normal axis, normal intervals, no concerning ST changes noted.  ____________________________________________    RADIOLOGY  Chest x-ray is negative  ____________________________________________   INITIAL IMPRESSION / ASSESSMENT AND PLAN / ED COURSE  Pertinent labs & imaging results that were  available during my care of the patient were reviewed by me and considered in my medical decision making (see chart for details).   Patient presents to the emergency department for chest pain starting around 2 PM today after mowing his lawn.  Some radiation to the left neck.  Patient took 3 baby aspirin at home.  Patient arrives as a code STEMI by EMS emergency traffic.  Upon arrival patient denies any chest pain at this time does state very slight shortness of breath sensation.  Patient's repeat EKG in the emergency department does not appear to show any concerning ST changes at this time.  Differential would include ACS, angina, chest wall pain, pneumonia, pneumothorax.  We will check labs including cardiac enzymes, chest x-ray and continue to closely monitor.  Patient does appear moderately anxious but otherwise reassuring.  Patient's labs show a negative troponin.  Does show hypokalemia and a moderate leukocytosis.  In speaking to the patient further he does admit to alcohol use approximately 3 40 ounce beers per day.  Patient remains chest pain-free at this time.  We will obtain a d-dimer as a precaution.  We will continue to closely monitor and anticipate a repeat troponin in 2 to 3 hours if the patient remains symptom-free/chest pain-free and repeat troponin is negative anticipate likely discharge.  Patient's d-dimer is negative.  We will repeat a troponin.  If repeat troponin is negative anticipate likely discharge home.  Patient agreeable to plan of care.  Patient care signed out to oncoming physician.  Tonia GhentSharmaine Beneke was evaluated in Emergency Department on 12/11/2018 for the symptoms described in the history of present illness. He was evaluated in the context of the global COVID-19 pandemic, which necessitated consideration that the patient might be at risk for infection with the SARS-CoV-2 virus that causes COVID-19. Institutional protocols and algorithms that pertain to the evaluation of  patients at risk for COVID-19 are in a state of rapid change based on information released by regulatory bodies including the CDC and federal and state organizations. These policies and algorithms were followed during the patient's care in the ED.  ____________________________________________   FINAL CLINICAL IMPRESSION(S) / ED DIAGNOSES  Chest pain   Minna AntisPaduchowski, Livy Ross, MD 12/11/18 2226

## 2018-12-11 NOTE — ED Triage Notes (Signed)
Pt came in from mowing grass around 2pm and had cp into neck (left) and sob.

## 2018-12-12 LAB — TROPONIN I: Troponin I: <0.03 ng/mL (ref <0.03)

## 2018-12-12 NOTE — ED Provider Notes (Signed)
-----------------------------------------   1:42 AM on 12/12/2018 -----------------------------------------  Repeat troponin was negative.  Patient asymptomatic, eating and drinking in ED without difficulty.  Discharging as per Dr. Awanda Mink recommendations and AVS.   Loleta Rose, MD 12/12/18 [phone]

## 2018-12-22 ENCOUNTER — Encounter: Payer: Self-pay | Admitting: Emergency Medicine

## 2018-12-22 ENCOUNTER — Emergency Department
Admission: EM | Admit: 2018-12-22 | Discharge: 2018-12-22 | Disposition: A | Discharge status: Home / Self Care | Payer: Self-pay | Attending: Emergency Medicine | Admitting: Emergency Medicine

## 2018-12-22 ENCOUNTER — Other Ambulatory Visit: Payer: Self-pay

## 2018-12-22 ENCOUNTER — Emergency Department: Payer: Self-pay

## 2018-12-22 DIAGNOSIS — M791 Myalgia, unspecified site: Secondary | ICD-10-CM | POA: Insufficient documentation

## 2018-12-22 DIAGNOSIS — R079 Chest pain, unspecified: Secondary | ICD-10-CM | POA: Insufficient documentation

## 2018-12-22 DIAGNOSIS — R1011 Right upper quadrant pain: Secondary | ICD-10-CM | POA: Insufficient documentation

## 2018-12-22 DIAGNOSIS — Z79899 Other long term (current) drug therapy: Secondary | ICD-10-CM | POA: Insufficient documentation

## 2018-12-22 DIAGNOSIS — F17210 Nicotine dependence, cigarettes, uncomplicated: Secondary | ICD-10-CM | POA: Insufficient documentation

## 2018-12-22 LAB — CBC WITH DIFFERENTIAL/PLATELET
Abs Immature Granulocytes: 0.04 10*3/uL (ref 0.00–0.07)
Basophils Absolute: 0.1 10*3/uL (ref 0.0–0.1)
Basophils Relative: 0 %
Eosinophils Absolute: 0.1 10*3/uL (ref 0.0–0.5)
Eosinophils Relative: 1 %
HCT: 42.5 % (ref 39.0–52.0)
Hemoglobin: 13.9 g/dL (ref 13.0–17.0)
Immature Granulocytes: 0 %
Lymphocytes Relative: 33 %
Lymphs Abs: 4.4 10*3/uL — ABNORMAL HIGH (ref 0.7–4.0)
MCH: 29.8 pg (ref 26.0–34.0)
MCHC: 32.7 g/dL (ref 30.0–36.0)
MCV: 91.2 fL (ref 80.0–100.0)
Monocytes Absolute: 0.9 10*3/uL (ref 0.1–1.0)
Monocytes Relative: 7 %
Neutro Abs: 7.8 10*3/uL — ABNORMAL HIGH (ref 1.7–7.7)
Neutrophils Relative %: 59 %
Platelets: 365 10*3/uL (ref 150–400)
RBC: 4.66 MIL/uL (ref 4.22–5.81)
RDW: 12.3 % (ref 11.5–15.5)
WBC: 13.4 10*3/uL — ABNORMAL HIGH (ref 4.0–10.5)
nRBC: 0 % (ref 0.0–0.2)

## 2018-12-22 LAB — TROPONIN I: Troponin I: <0.03 ng/mL (ref <0.03)

## 2018-12-22 LAB — COMPREHENSIVE METABOLIC PANEL
ALT: 112 U/L — ABNORMAL HIGH (ref 0–44)
AST: 52 U/L — ABNORMAL HIGH (ref 15–41)
Albumin: 4.5 g/dL (ref 3.5–5.0)
Alkaline Phosphatase: 83 U/L (ref 38–126)
Anion gap: 8 (ref 5–15)
BUN: 18 mg/dL (ref 6–20)
CO2: 25 mmol/L (ref 22–32)
Calcium: 9.2 mg/dL (ref 8.9–10.3)
Chloride: 107 mmol/L (ref 98–111)
Creatinine, Ser: 1.24 mg/dL (ref 0.61–1.24)
GFR calc Af Amer: >60 mL/min (ref >60)
GFR calc non Af Amer: >60 mL/min (ref >60)
Glucose, Bld: 111 mg/dL — ABNORMAL HIGH (ref 70–99)
Potassium: 4 mmol/L (ref 3.5–5.1)
Sodium: 140 mmol/L (ref 135–145)
Total Bilirubin: 0.6 mg/dL (ref 0.3–1.2)
Total Protein: 7.3 g/dL (ref 6.5–8.1)

## 2018-12-22 NOTE — ED Notes (Signed)
ED Provider at bedside. 

## 2018-12-22 NOTE — ED Notes (Signed)
Pt states he has multiple muscle spasms and various uncontrollable cramps at times. Pt feels like there are several strainge things that make him unsure what is wrong.

## 2018-12-22 NOTE — ED Triage Notes (Signed)
Patient ambulatory to triage with steady gait, without difficulty or distress noted; pt reports left sided CP radiating into neck since 6/1; dx with hypokalemia and dehydration; f/u with cardiology and stress test sched for 6/15; pt c/o muscle spasms; pt st he stopped taking the rx potassium because the hospital in  [facility] told him his level was fine; pt st he has been seen 4xs for this problem "and I keep coming back here and yall tell me nothing is wrong"

## 2018-12-23 NOTE — ED Provider Notes (Signed)
[facility] Emergency Department Provider Note   First MD Initiated Contact with Patient 12/22/18 [phone]     (approximate)  I have reviewed the triage vital signs and the nursing notes.   HISTORY  Chief Complaint Chest Pain    HPI Henry Mccann is a 33 y.o. male no known past medical history presents emergency department secondary to chest pain.  Patient states that the pain is located in the left side of his chest radiating to his neck since June 1 when he was seen in the emergency department.  Patient also admits to other complaints including generalized muscle aches which he states has improved since he was diagnosed with hypokalemia and prescribed potassium on 6 1.  She denies any dyspnea no nausea or vomiting no diaphoresis.  Patient states that he has an upcoming appointment with cardiology for stress test on 12/26/2018       History reviewed. No pertinent past medical history.  There are no active problems to display for this patient.   History reviewed. No pertinent surgical history.  Prior to Admission medications   Medication Sig Start Date End Date Taking? Authorizing Provider  cetirizine (ZYRTEC ALLERGY) 10 MG tablet Take 1 tablet (10 mg total) by mouth daily. 10/05/18   Laban Emperor, PA-C  ketorolac (ACULAR) 0.4 % SOLN Place 1 drop into both eyes 4 (four) times daily as needed. 10/05/18   Laban Emperor, PA-C  naphazoline-pheniramine (NAPHCON-A) 0.025-0.3 % ophthalmic solution Place 1 drop into both eyes 4 (four) times daily as needed for eye irritation. 10/05/18   Laban Emperor, PA-C  potassium chloride (K-DUR) 10 MEQ tablet Take 1 tablet (10 mEq total) by mouth 2 (two) times daily. 12/11/18   Harvest Dark, MD    Allergies Tramadol  No family history on file.  Social History Social History   Tobacco Use  . Smoking status: Current Every Day Smoker    Packs/day: 1.00    Types: Cigarettes  . Smokeless tobacco: Never Used   Substance Use Topics  . Alcohol use: Yes    Alcohol/week: 5.0 standard drinks    Types: 5 Cans of beer per week    Comment: daily  . Drug use: Yes    Types: Marijuana    Review of Systems Constitutional: No fever/chills Eyes: No visual changes. ENT: No sore throat. Cardiovascular: Positive for chest pain. Respiratory: Denies shortness of breath. Gastrointestinal: No abdominal pain.  No nausea, no vomiting.  No diarrhea.  No constipation. Genitourinary: Negative for dysuria. Musculoskeletal: Negative for neck pain.  Negative for back pain. Integumentary: Negative for rash. Neurological: Negative for headaches, focal weakness or numbness.  ____________________________________________   PHYSICAL EXAM:  VITAL SIGNS: ED Triage Vitals  Enc Vitals Group     BP 12/22/18 0255 129/73     Pulse Rate 12/22/18 0255 79     Resp 12/22/18 0255 18     Temp 12/22/18 0255 98.4 F (36.9 C)     Temp Source 12/22/18 0255 Oral     SpO2 12/22/18 0255 99 %     Weight 12/22/18 0255 136.1 kg (300 lb)     Height 12/22/18 0255 1.676 m (5\' 6" )     Head Circumference --      Peak Flow --      Pain Score 12/22/18 0304 2     Pain Loc --      Pain Edu? --      Excl. in [place]? --     Constitutional: Alert  and oriented. Well appearing and in no acute distress. Eyes: Conjunctivae are normal.  Head: Atraumatic. Mouth/Throat: Mucous membranes are moist.  Oropharynx non-erythematous. Neck: No stridor.   Cardiovascular: Normal rate, regular rhythm. Good peripheral circulation. Grossly normal heart sounds. Respiratory: Normal respiratory effort.  No retractions. No audible wheezing. Gastrointestinal: Soft and nontender. No distention.  Musculoskeletal: No lower extremity tenderness nor edema. No gross deformities of extremities. Neurologic:  Normal speech and language. No gross focal neurologic deficits are appreciated.  Skin:  Skin is warm, dry and intact. No rash noted. Psychiatric: Mood and affect  are normal. Speech and behavior are normal.  ____________________________________________   LABS (all labs ordered are listed, but only abnormal results are displayed)  Labs Reviewed  CBC WITH DIFFERENTIAL/PLATELET - Abnormal; Notable for the following components:      Result Value   WBC 13.4 (*)    Neutro Abs 7.8 (*)    Lymphs Abs 4.4 (*)    All other components within normal limits  COMPREHENSIVE METABOLIC PANEL - Abnormal; Notable for the following components:   Glucose, Bld 111 (*)    AST 52 (*)    ALT 112 (*)    All other components within normal limits  TROPONIN I   ____________________________________________  EKG  ED ECG REPORT I, Henry Mccann, the attending physician, personally viewed and interpreted this ECG.   Date: 12/22/2018  EKG Time: 2:57 AM  Rate: 84  Rhythm: Normal sinus rhythm  Axis: Normal  Intervals: Normal  ST&T Change: None ____________________________    Procedures   ____________________________________________   INITIAL IMPRESSION / MDM / ASSESSMENT AND PLAN / ED COURSE  As part of my medical decision making, I reviewed the following data within the electronic MEDICAL RECORD NUMBER   33 year old male presenting to the emergency department above-stated history and physical exam secondary to chest pain.  EKG revealed no evidence of ischemia or infarction troponin negative.  D-dimer was performed at the patient's last encounter which was also negative.  No clear etiology for the patient's chest pain identified I advised the patient to keep his appointment with cardiology as planned.     *Henry Mccann was evaluated in Emergency Department on 12/23/2018 for the symptoms described in the history of present illness. He was evaluated in the context of the global COVID-19 pandemic, which necessitated consideration that the patient might be at risk for infection with the SARS-CoV-2 virus that causes COVID-19. Institutional protocols and algorithms  that pertain to the evaluation of patients at risk for COVID-19 are in a state of rapid change based on information released by regulatory bodies including the CDC and federal and state organizations. These policies and algorithms were followed during the patient's care in the ED.  Some ED evaluations and interventions may be delayed as a result of limited staffing during the pandemic.*       ____________________________________________  FINAL CLINICAL IMPRESSION(S) / ED DIAGNOSES  Final diagnoses:  RUQ pain  Chest pain, unspecified type     MEDICATIONS GIVEN DURING THIS VISIT:  Medications - No data to display   ED Discharge Orders    None       Note:  This document was prepared using Dragon voice recognition software and may include unintentional dictation errors.   Darci CurrentBrown, [place] N, MD 12/23/18 [phone]

## 2019-07-11 ENCOUNTER — Other Ambulatory Visit: Payer: Self-pay

## 2019-07-11 DIAGNOSIS — R079 Chest pain, unspecified: Secondary | ICD-10-CM | POA: Insufficient documentation

## 2019-07-11 DIAGNOSIS — Z5321 Procedure and treatment not carried out due to patient leaving prior to being seen by health care provider: Secondary | ICD-10-CM | POA: Insufficient documentation

## 2019-07-12 ENCOUNTER — Encounter: Payer: Self-pay | Admitting: Emergency Medicine

## 2019-07-12 ENCOUNTER — Emergency Department: Payer: Self-pay

## 2019-07-12 ENCOUNTER — Emergency Department
Admission: EM | Admit: 2019-07-12 | Discharge: 2019-07-12 | Disposition: A | Discharge status: Home / Self Care | Payer: Self-pay | Attending: Emergency Medicine | Admitting: Emergency Medicine

## 2019-07-12 ENCOUNTER — Other Ambulatory Visit: Payer: Self-pay

## 2019-07-12 LAB — CBC WITH DIFFERENTIAL/PLATELET
Abs Immature Granulocytes: 0.03 10*3/uL (ref 0.00–0.07)
Basophils Absolute: 0.1 10*3/uL (ref 0.0–0.1)
Basophils Relative: 1 %
Eosinophils Absolute: 0.2 10*3/uL (ref 0.0–0.5)
Eosinophils Relative: 2 %
HCT: 41.7 % (ref 39.0–52.0)
Hemoglobin: 13.6 g/dL (ref 13.0–17.0)
Immature Granulocytes: 0 %
Lymphocytes Relative: 35 %
Lymphs Abs: 4.2 10*3/uL — ABNORMAL HIGH (ref 0.7–4.0)
MCH: 29.2 pg (ref 26.0–34.0)
MCHC: 32.6 g/dL (ref 30.0–36.0)
MCV: 89.7 fL (ref 80.0–100.0)
Monocytes Absolute: 0.7 10*3/uL (ref 0.1–1.0)
Monocytes Relative: 6 %
Neutro Abs: 7 10*3/uL (ref 1.7–7.7)
Neutrophils Relative %: 56 %
Platelets: 346 10*3/uL (ref 150–400)
RBC: 4.65 MIL/uL (ref 4.22–5.81)
RDW: 12.8 % (ref 11.5–15.5)
WBC: 12.2 10*3/uL — ABNORMAL HIGH (ref 4.0–10.5)
nRBC: 0 % (ref 0.0–0.2)

## 2019-07-12 LAB — COMPREHENSIVE METABOLIC PANEL
ALT: 32 U/L (ref 0–44)
AST: 27 U/L (ref 15–41)
Albumin: 4.4 g/dL (ref 3.5–5.0)
Alkaline Phosphatase: 71 U/L (ref 38–126)
Anion gap: 8 (ref 5–15)
BUN: 13 mg/dL (ref 6–20)
CO2: 26 mmol/L (ref 22–32)
Calcium: 8.9 mg/dL (ref 8.9–10.3)
Chloride: 106 mmol/L (ref 98–111)
Creatinine, Ser: 1.26 mg/dL — ABNORMAL HIGH (ref 0.61–1.24)
GFR calc Af Amer: >60 mL/min (ref >60)
GFR calc non Af Amer: >60 mL/min (ref >60)
Glucose, Bld: 99 mg/dL (ref 70–99)
Potassium: 3.5 mmol/L (ref 3.5–5.1)
Sodium: 140 mmol/L (ref 135–145)
Total Bilirubin: 0.8 mg/dL (ref 0.3–1.2)
Total Protein: 7.2 g/dL (ref 6.5–8.1)

## 2019-07-12 LAB — TROPONIN I (HIGH SENSITIVITY): Troponin I (High Sensitivity): 4 ng/L (ref <18)

## 2019-07-12 NOTE — ED Notes (Signed)
No answer when called several times from lobby 

## 2019-07-12 NOTE — ED Triage Notes (Signed)
Patient ambulatory to triage with steady gait, without difficulty or distress noted; pt reports upper CP for several "months"; st pain is intermittent and at times radiates into arms; denies any accomp symptoms; denies any pain at present

## 2019-07-14 ENCOUNTER — Telehealth: Payer: Self-pay | Admitting: Emergency Medicine

## 2019-07-14 NOTE — Telephone Encounter (Signed)
Called patient due to lwot to inquire about condition and follow up plans. Left message.   

## 2020-01-04 ENCOUNTER — Emergency Department
Admission: EM | Admit: 2020-01-04 | Discharge: 2020-01-04 | Disposition: A | Discharge status: Home / Self Care | Payer: Self-pay | Attending: Emergency Medicine | Admitting: Emergency Medicine

## 2020-01-04 ENCOUNTER — Encounter: Payer: Self-pay | Admitting: Emergency Medicine

## 2020-01-04 ENCOUNTER — Other Ambulatory Visit: Payer: Self-pay

## 2020-01-04 DIAGNOSIS — Z5321 Procedure and treatment not carried out due to patient leaving prior to being seen by health care provider: Secondary | ICD-10-CM | POA: Insufficient documentation

## 2020-01-04 DIAGNOSIS — R519 Headache, unspecified: Secondary | ICD-10-CM | POA: Insufficient documentation

## 2020-01-04 NOTE — ED Notes (Signed)
Pt has not returned

## 2020-01-04 NOTE — ED Notes (Signed)
Pt immed left before vs taken

## 2020-01-04 NOTE — ED Triage Notes (Signed)
Patient ambulatory to triage with steady gait, without difficulty or distress noted, mask in place; pt reports left sided HA several days with no accomp symptoms; denies hx of same

## 2020-02-13 ENCOUNTER — Encounter: Payer: Self-pay | Admitting: *Deleted

## 2020-02-13 ENCOUNTER — Other Ambulatory Visit: Payer: Self-pay

## 2020-02-13 ENCOUNTER — Emergency Department
Admission: EM | Admit: 2020-02-13 | Discharge: 2020-02-13 | Disposition: A | Discharge status: Home / Self Care | Payer: Self-pay | Attending: Emergency Medicine | Admitting: Emergency Medicine

## 2020-02-13 DIAGNOSIS — R519 Headache, unspecified: Secondary | ICD-10-CM | POA: Insufficient documentation

## 2020-02-13 DIAGNOSIS — Z5321 Procedure and treatment not carried out due to patient leaving prior to being seen by health care provider: Secondary | ICD-10-CM | POA: Insufficient documentation

## 2020-02-13 DIAGNOSIS — H9202 Otalgia, left ear: Secondary | ICD-10-CM | POA: Insufficient documentation

## 2020-02-13 NOTE — ED Notes (Signed)
No answer when called several times from lobby 

## 2020-02-13 NOTE — ED Triage Notes (Signed)
Pt has left earache and headache for 2 days.  Taking otc meds without relief.   Pt alert  Speech clear.

## 2021-04-23 IMAGING — DX PORTABLE CHEST - 1 VIEW
1 series · 1 of 1 positions shown · non-contrast
Comparison: None.

CLINICAL DATA: Chest pain

EXAM:
PORTABLE CHEST 1 VIEW

[chest ap]
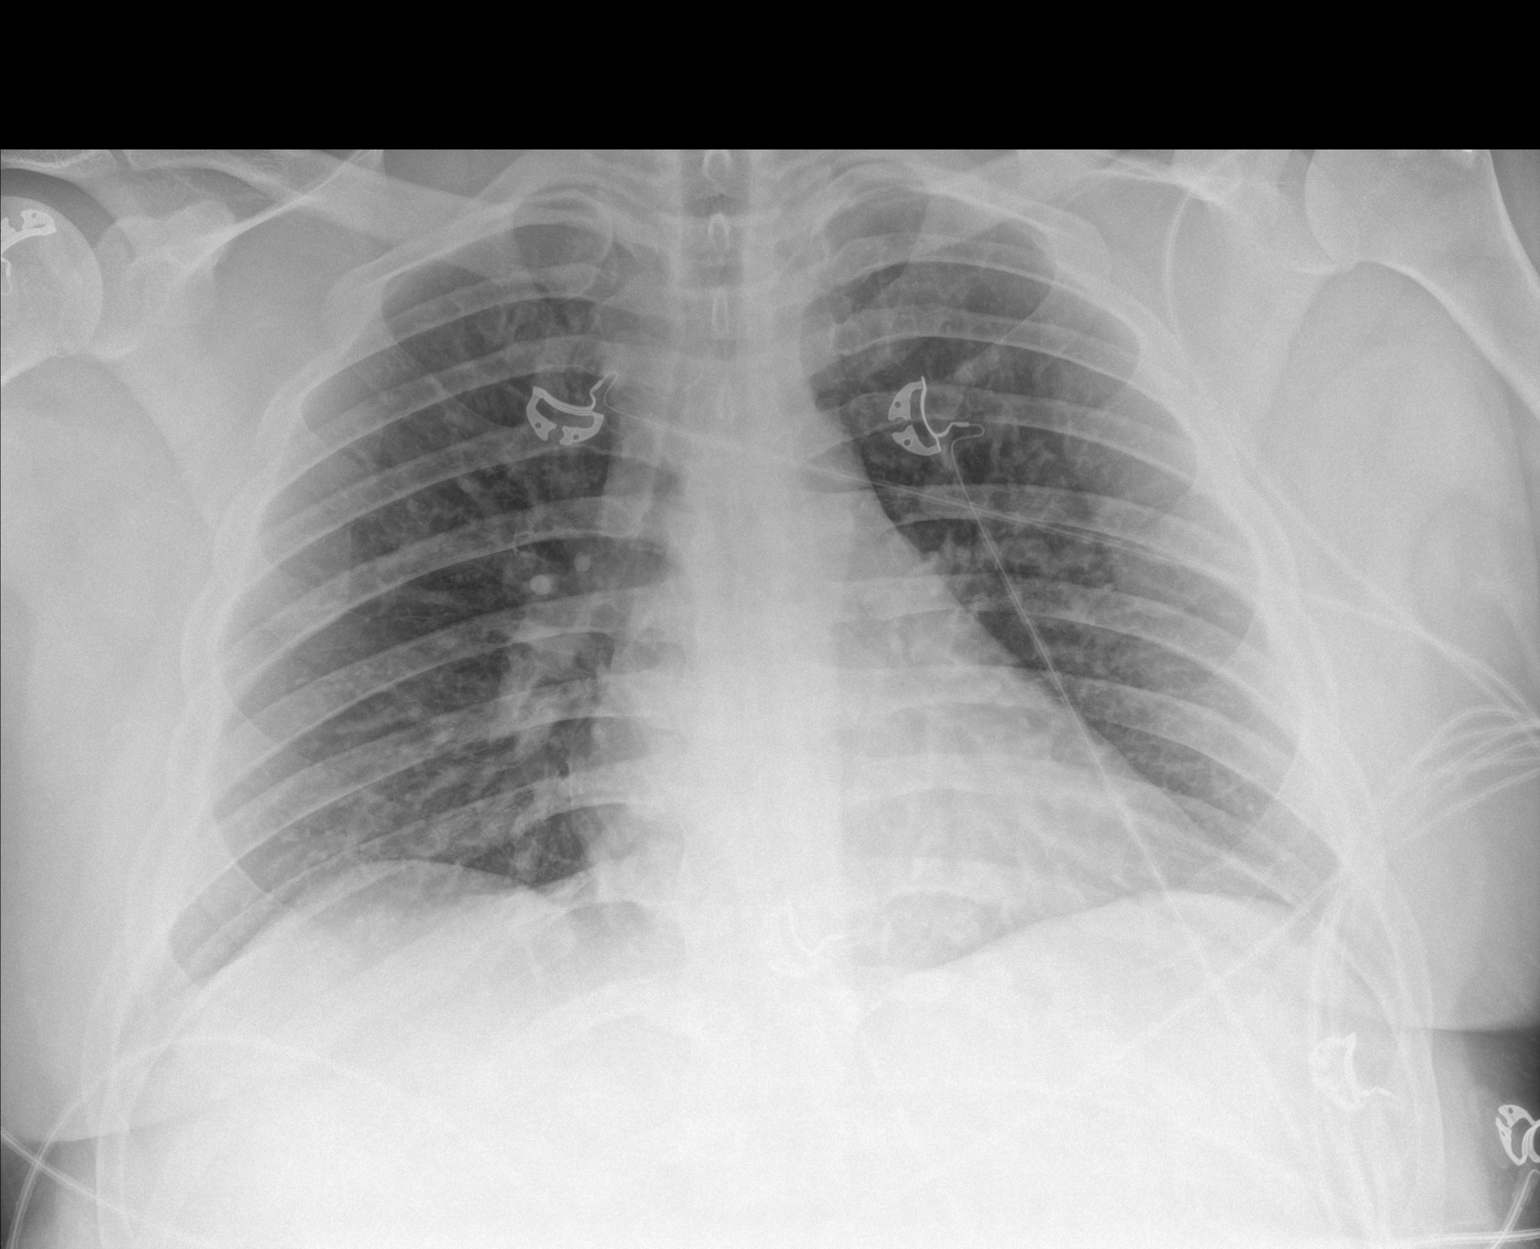

[1 of 1 positions shown; findings below may reference images not displayed]

FINDINGS: Heart and mediastinal contours are within normal limits. No focal
opacities or effusions. No acute bony abnormality.
IMPRESSION: No active disease.

## 2021-11-22 IMAGING — CR DG CHEST 2V
1 series · 2 of 2 positions shown · non-contrast
Comparison: Prior radiograph from 12/11/2018.

CLINICAL DATA: Initial evaluation for acute chest pain.

EXAM:
CHEST - 2 VIEW

[Series 1: dg chest 2 view · 0.14mm/px · 2 of 2 slices shown]
[im 1/2]
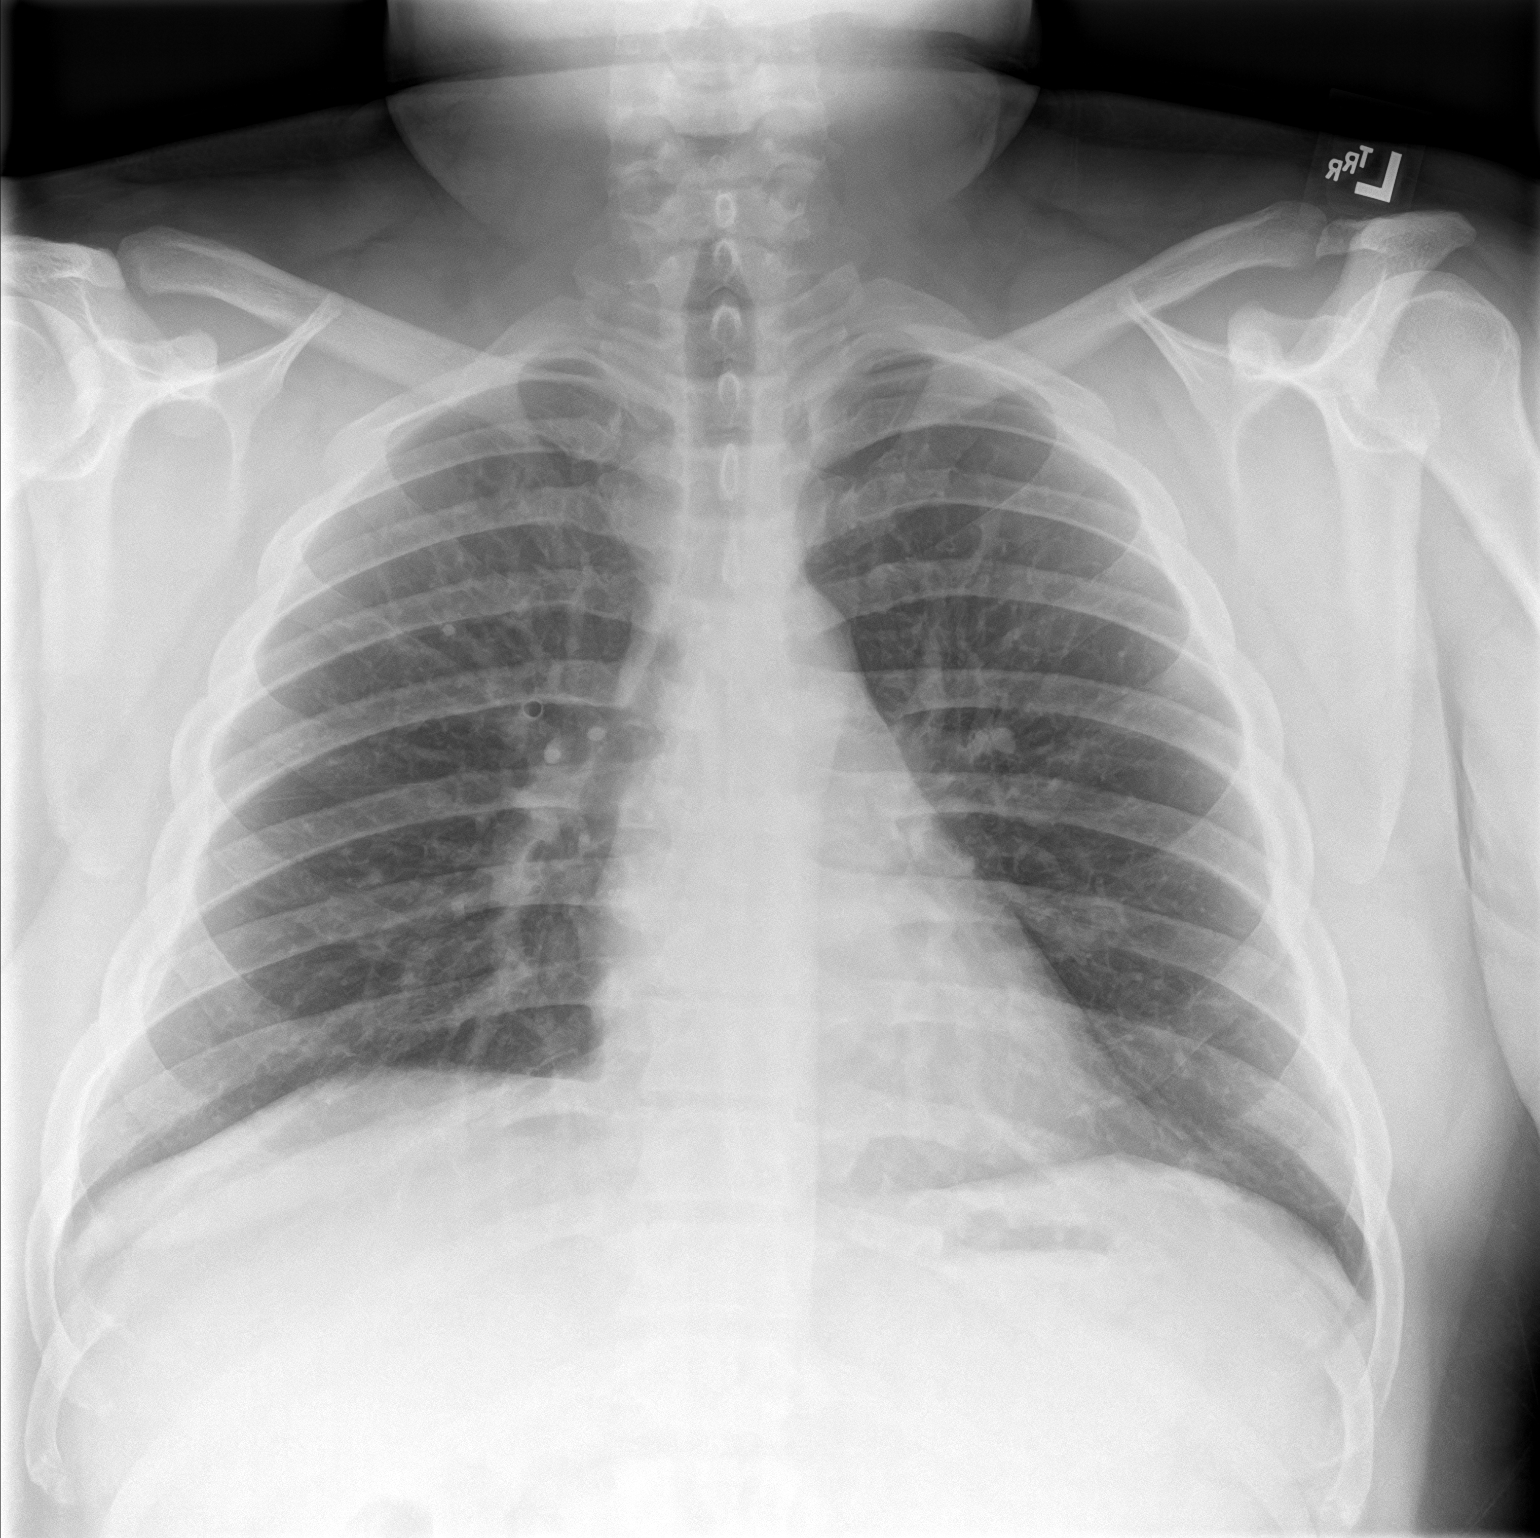
[im 2/2]
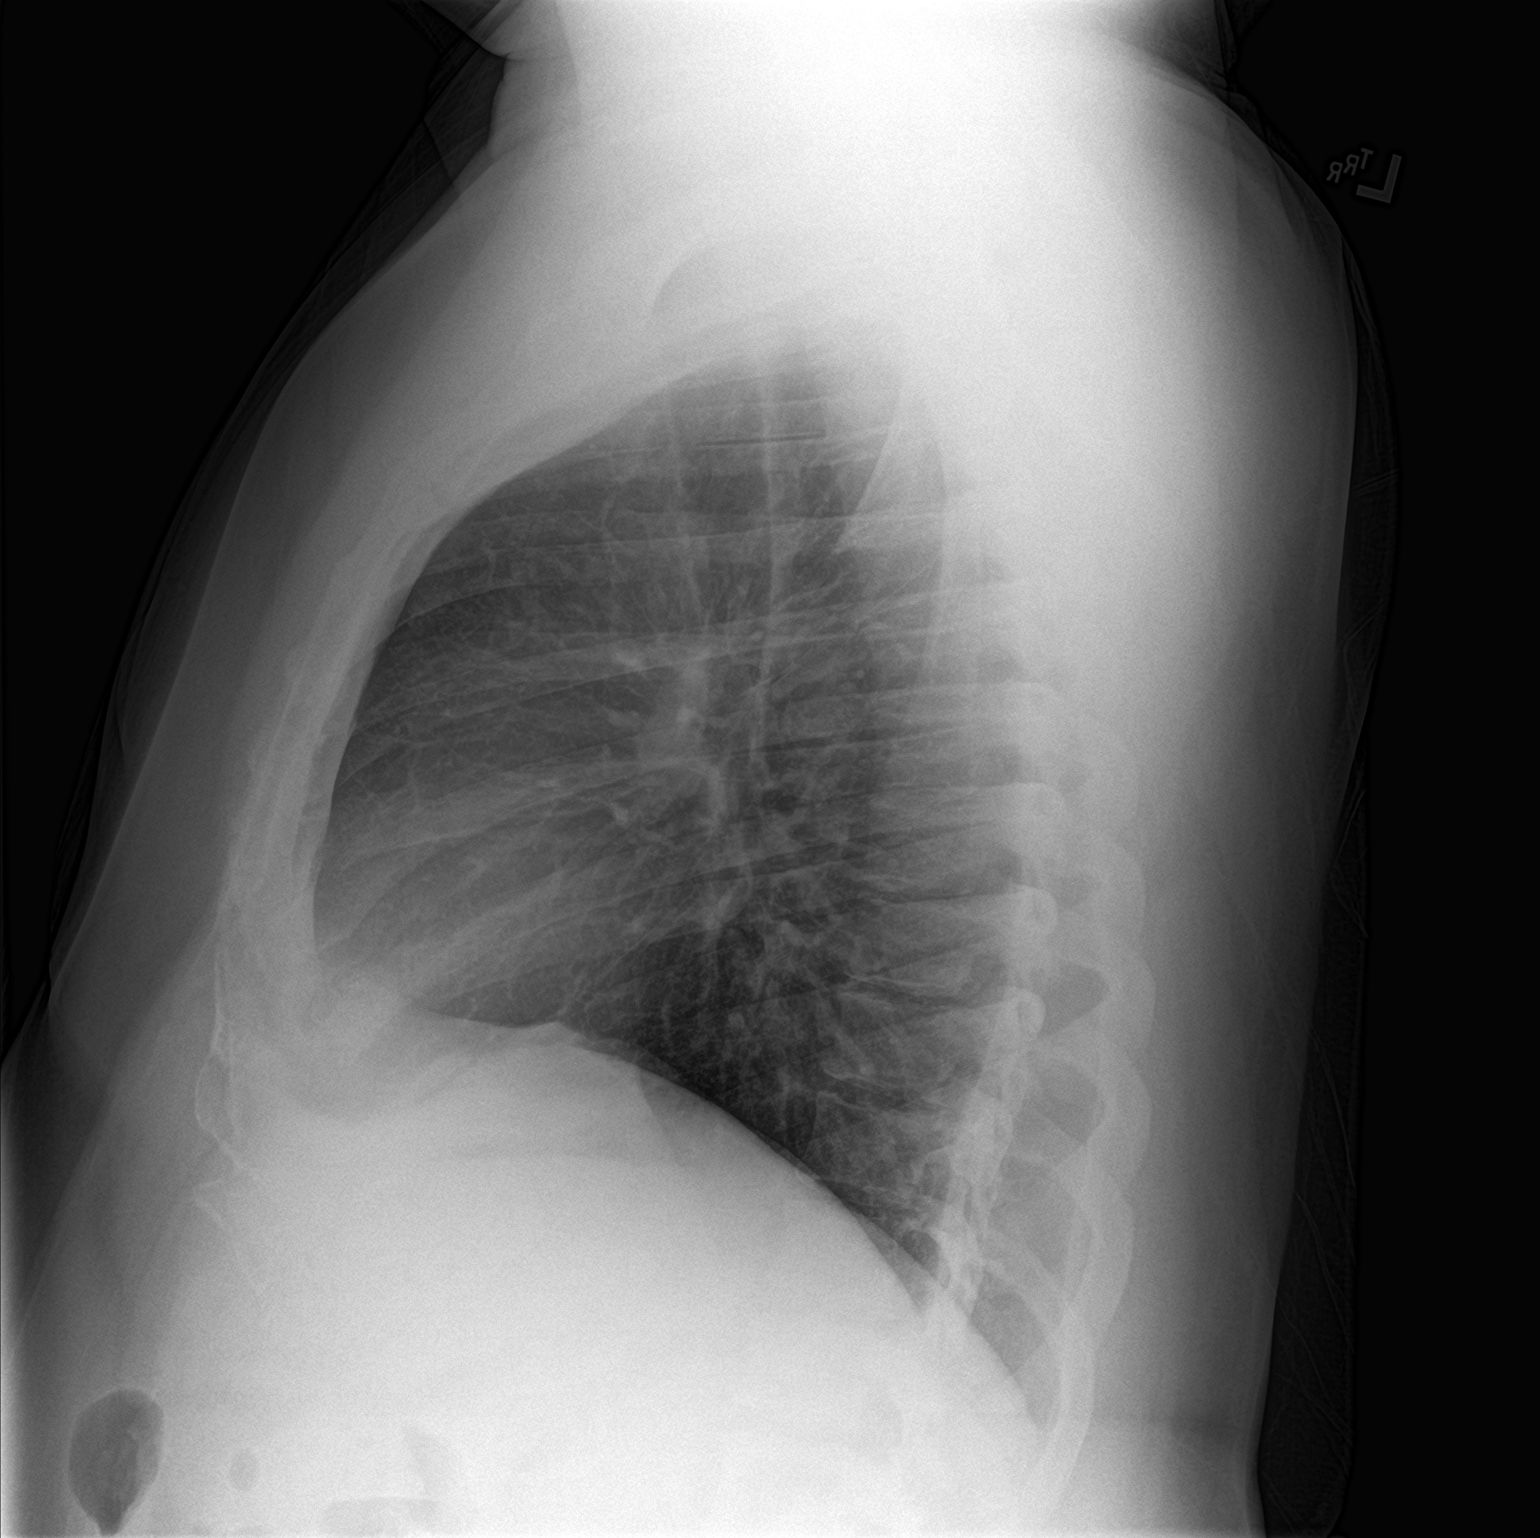

[2 of 2 positions shown; findings below may reference images not displayed]

FINDINGS: The cardiac and mediastinal silhouettes are stable in size and
contour, and remain within normal limits.

The lungs are normally inflated. No airspace consolidation, pleural
effusion, or pulmonary edema. No pneumothorax.

No acute osseous abnormality.
IMPRESSION: No active cardiopulmonary disease.
